# Patient Record
Sex: Female | Born: 1935 | Smoking: Never smoker
Health system: Southern US, Community
[De-identification: ages and names within clinical notes are randomized; demographics above are authoritative.]

## PROBLEM LIST (undated history)

## (undated) DIAGNOSIS — N390 Urinary tract infection, site not specified: Secondary | ICD-10-CM

## (undated) DIAGNOSIS — E079 Disorder of thyroid, unspecified: Secondary | ICD-10-CM

## (undated) DIAGNOSIS — K219 Gastro-esophageal reflux disease without esophagitis: Secondary | ICD-10-CM

## (undated) DIAGNOSIS — I10 Essential (primary) hypertension: Secondary | ICD-10-CM

## (undated) DIAGNOSIS — M810 Age-related osteoporosis without current pathological fracture: Secondary | ICD-10-CM

## (undated) DIAGNOSIS — E785 Hyperlipidemia, unspecified: Secondary | ICD-10-CM

## (undated) DIAGNOSIS — J45909 Unspecified asthma, uncomplicated: Secondary | ICD-10-CM

## (undated) HISTORY — DX: Urinary tract infection, site not specified: N39.0

## (undated) HISTORY — PX: FACIAL COSMETIC SURGERY: SHX629

## (undated) HISTORY — DX: Disorder of thyroid, unspecified: E07.9

## (undated) HISTORY — DX: Age-related osteoporosis without current pathological fracture: M81.0

## (undated) HISTORY — DX: Unspecified asthma, uncomplicated: J45.909

## (undated) HISTORY — DX: Essential (primary) hypertension: I10

## (undated) HISTORY — PX: CATARACT EXTRACTION: SUR2

## (undated) HISTORY — DX: Hyperlipidemia, unspecified: E78.5

## (undated) HISTORY — PX: TRIGGER FINGER RELEASE: SHX641

## (undated) HISTORY — PX: INCONTINENCE SURGERY: SHX676

## (undated) HISTORY — DX: Gastro-esophageal reflux disease without esophagitis: K21.9

## (undated) HISTORY — PX: ABDOMINAL HYSTERECTOMY: SHX81

---

## 2012-04-29 ENCOUNTER — Ambulatory Visit: Payer: Self-pay | Admitting: Physician Assistant

## 2012-05-03 ENCOUNTER — Emergency Department: Payer: Self-pay | Admitting: *Deleted

## 2012-05-03 LAB — URINALYSIS, COMPLETE
Bilirubin,UR: NEGATIVE
Blood: NEGATIVE
Glucose,UR: NEGATIVE mg/dL (ref 0–75)
Leukocyte Esterase: NEGATIVE
Nitrite: NEGATIVE
Ph: 5 (ref 4.5–8.0)
RBC,UR: 1 /HPF (ref 0–5)
Squamous Epithelial: 12
WBC UR: 1 /HPF (ref 0–5)

## 2012-05-03 LAB — CBC
HGB: 13 g/dL (ref 12.0–16.0)
MCH: 32.8 pg (ref 26.0–34.0)
MCHC: 33.3 g/dL (ref 32.0–36.0)
MCV: 99 fL (ref 80–100)
Platelet: 351 10*3/uL (ref 150–440)
RBC: 3.97 10*6/uL (ref 3.80–5.20)
RDW: 12.9 % (ref 11.5–14.5)

## 2012-05-03 LAB — COMPREHENSIVE METABOLIC PANEL
Alkaline Phosphatase: 70 U/L (ref 50–136)
Anion Gap: 7 (ref 7–16)
BUN: 12 mg/dL (ref 7–18)
Bilirubin,Total: 0.3 mg/dL (ref 0.2–1.0)
Calcium, Total: 9.1 mg/dL (ref 8.5–10.1)
Chloride: 101 mmol/L (ref 98–107)
Co2: 28 mmol/L (ref 21–32)
Creatinine: 1.01 mg/dL (ref 0.60–1.30)
EGFR (African American): >60
EGFR (Non-African Amer.): 53 — ABNORMAL LOW
SGOT(AST): 24 U/L (ref 15–37)
SGPT (ALT): 17 U/L (ref 12–78)
Total Protein: 8.1 g/dL (ref 6.4–8.2)

## 2017-10-04 DIAGNOSIS — N390 Urinary tract infection, site not specified: Secondary | ICD-10-CM | POA: Insufficient documentation

## 2017-10-04 DIAGNOSIS — M810 Age-related osteoporosis without current pathological fracture: Secondary | ICD-10-CM | POA: Insufficient documentation

## 2017-10-04 DIAGNOSIS — K219 Gastro-esophageal reflux disease without esophagitis: Secondary | ICD-10-CM | POA: Insufficient documentation

## 2017-10-04 DIAGNOSIS — E7800 Pure hypercholesterolemia, unspecified: Secondary | ICD-10-CM | POA: Insufficient documentation

## 2017-10-04 DIAGNOSIS — I10 Essential (primary) hypertension: Secondary | ICD-10-CM | POA: Insufficient documentation

## 2017-10-04 DIAGNOSIS — J4540 Moderate persistent asthma, uncomplicated: Secondary | ICD-10-CM | POA: Insufficient documentation

## 2017-10-04 DIAGNOSIS — E039 Hypothyroidism, unspecified: Secondary | ICD-10-CM | POA: Insufficient documentation

## 2018-10-10 ENCOUNTER — Ambulatory Visit (INDEPENDENT_AMBULATORY_CARE_PROVIDER_SITE_OTHER): Payer: Medicare Other | Admitting: Urology

## 2018-10-10 ENCOUNTER — Encounter: Payer: Self-pay | Admitting: Urology

## 2018-10-10 VITALS — BP 130/66 | HR 80 | Ht 59.0 in | Wt 131.9 lb

## 2018-10-10 DIAGNOSIS — N390 Urinary tract infection, site not specified: Secondary | ICD-10-CM | POA: Diagnosis not present

## 2018-10-10 LAB — MICROSCOPIC EXAMINATION

## 2018-10-10 LAB — URINALYSIS, COMPLETE
BILIRUBIN UA: NEGATIVE
Glucose, UA: NEGATIVE
Ketones, UA: NEGATIVE
Nitrite, UA: NEGATIVE
PROTEIN UA: NEGATIVE
Specific Gravity, UA: 1.015 (ref 1.005–1.030)
Urobilinogen, Ur: 0.2 mg/dL (ref 0.2–1.0)
pH, UA: 6 (ref 5.0–7.5)

## 2018-10-10 MED ORDER — SULFAMETHOXAZOLE-TRIMETHOPRIM 800-160 MG PO TABS: 1.0000 | ORAL_TABLET | Freq: Every day | ORAL | 6 refills | Status: DC

## 2018-10-10 NOTE — Progress Notes (Signed)
   10/10/2018 3:14 PM   Shelby Gentry April 02, 1931 [id number]  Referring provider: Virgina Jock, [place] [address] [place], [place] [address]  CC: Recurrent UTI  HPI: I saw Shelby Gentry in urology clinic today in consultation for recurrent urinary tract infections.  She is an 85 year old female with a long history of recurrent infections, with 4-5 UTIs per year for at least 5 years.  In care everywhere, she has multiple recent positive urine cultures including 09/11/2018(E. Coli), 08/23/2018(E. Coli), 06/08/2018(Citrobacter), and 05/12/2018 (Citrobacter).  Her history is notable for a reported "bladder sling " 40 years ago.  She denies any significant urinary incontinence.  When she has a UTI her symptoms are primarily dysuria and pelvic pain.  She denies any history of gross hematuria or flank pain.  She was previously evaluated by a urologist in [place] that recommended cranberry prophylaxis.  She has been on this for 3 months without any improvement.  There are no aggravating or alleviating factors.  Severity is moderate to severe.   PMH: Hypothyroidism Hypercholesterolemia Hypertension Recurrent UTI Osteoporosis  Surgical History: Prior bladder sling  Allergies: Allergies not on file  Family History: No family history on file.  Social History:  has no history on file for tobacco, alcohol, and drug.  ROS: Please see flowsheet from today's date for complete review of systems.  Physical Exam:  Constitutional:  Alert and oriented, No acute distress. Cardiovascular: No clubbing, cyanosis, or edema. Respiratory: Normal respiratory effort, no increased work of breathing. GI: Abdomen is soft, nontender, nondistended, no abdominal masses GU: Deferred until time of cystoscopy Lymph: No cervical or inguinal lymphadenopathy. Skin: No rashes, bruises or suspicious lesions. Neurologic: Grossly intact, no focal deficits, moving all 4 extremities. Psychiatric: Normal mood and  affect.  Laboratory Data: Prior urine cultures reviewed, see HPI  Urinalysis today 0-5 WBCs, 0-2 RBCs, few bacteria, nitrite negative  Pertinent Imaging: None to review  Assessment & Plan:   In summary, the patient is an 85 year old female with a history of culture documented recurrent urinary tract infections, distant history of "bladder sling" who presents for further evaluation.  We discussed the evaluation and treatment of patients with recurrent UTIs at length.  We specifically discussed the differences between asymptomatic bacteriuria and true urinary tract infection.  We discussed the AUA definition of recurrent UTI of at least 2 culture proven symptomatic acute cystitis episodes in a 53-month period, or 3 within a 1 year period.  We discussed the importance of culture directed antibiotic treatment, and antibiotic stewardship.  First-line therapy includes nitrofurantoin(5 days), Bactrim(3 days), or fosfomycin(3 g single dose).  Possible etiologies of recurrent infection include periurethral tissue atrophy in postmenopausal woman, constipation, sexual activity, incomplete emptying, anatomic abnormalities, and even genetic predisposition.  Finally, we discussed the role of perineal hygiene, timed voiding, adequate hydration, topical vaginal estrogen, cranberry prophylaxis, and low-dose antibiotic prophylaxis.  -Trial of Bactrim prophylaxis, vaginal estrogen cream, continue cranberry supplements -Follow-up for cystoscopy to rule out erosion of bladder mesh -Consider further imaging if ongoing urinary tract infections  Sondra Come, MD  [facility] [address] [place], [place] [address] [phone]

## 2018-10-10 NOTE — Addendum Note (Signed)
Addended by: Frankey Shown on: 10/10/2018 03:42 PM   Modules accepted: Orders

## 2018-10-10 NOTE — Patient Instructions (Signed)
Urinary Tract Infection, Adult A urinary tract infection (UTI) is an infection of any part of the urinary tract. The urinary tract includes:  The kidneys.  The ureters.  The bladder.  The urethra. These organs make, store, and get rid of pee (urine) in the body. What are the causes? This is caused by germs (bacteria) in your genital area. These germs grow and cause swelling (inflammation) of your urinary tract. What increases the risk? You are more likely to develop this condition if:  You have a small, thin tube (catheter) to drain pee.  You cannot control when you pee or poop (incontinence).  You are female, and: ? You use these methods to prevent pregnancy: ? A medicine that kills sperm (spermicide). ? A device that blocks sperm (diaphragm). ? You have low levels of a female hormone (estrogen). ? You are pregnant.  You have genes that add to your risk.  You are sexually active.  You take antibiotic medicines.  You have trouble peeing because of: ? A prostate that is bigger than normal, if you are female. ? A blockage in the part of your body that drains pee from the bladder (urethra). ? A kidney stone. ? A nerve condition that affects your bladder (neurogenic bladder). ? Not getting enough to drink. ? Not peeing often enough.  You have other conditions, such as: ? Diabetes. ? A weak disease-fighting system (immune system). ? Sickle cell disease. ? Gout. ? Injury of the spine. What are the signs or symptoms? Symptoms of this condition include:  Needing to pee right away (urgently).  Peeing often.  Peeing small amounts often.  Pain or burning when peeing.  Blood in the pee.  Pee that smells bad or not like normal.  Trouble peeing.  Pee that is cloudy.  Fluid coming from the vagina, if you are female.  Pain in the belly or lower back. Other symptoms include:  Throwing up (vomiting).  No urge to eat.  Feeling mixed up (confused).  Being tired  and grouchy (irritable).  A fever.  Watery poop (diarrhea). How is this treated? This condition may be treated with:  Antibiotic medicine.  Other medicines.  Drinking enough water. Follow these instructions at home:  Medicines  Take over-the-counter and prescription medicines only as told by your doctor.  If you were prescribed an antibiotic medicine, take it as told by your doctor. Do not stop taking it even if you start to feel better. General instructions  Make sure you: ? Pee until your bladder is empty. ? Do not hold pee for a long time. ? Empty your bladder after sex. ? Wipe from front to back after pooping if you are a female. Use each tissue one time when you wipe.  Drink enough fluid to keep your pee pale yellow.  Keep all follow-up visits as told by your doctor. This is important. Contact a doctor if:  You do not get better after 1-2 days.  Your symptoms go away and then come back. Get help right away if:  You have very bad back pain.  You have very bad pain in your lower belly.  You have a fever.  You are sick to your stomach (nauseous).  You are throwing up. Summary  A urinary tract infection (UTI) is an infection of any part of the urinary tract.  This condition is caused by germs in your genital area.  There are many risk factors for a UTI. These include having a small, thin   tube to drain pee and not being able to control when you pee or poop.  Treatment includes antibiotic medicines for germs.  Drink enough fluid to keep your pee pale yellow. This information is not intended to replace advice given to you by your health care provider. Make sure you discuss any questions you have with your health care provider. Document Released: 01/12/2008 Document Revised: 02/02/2018 Document Reviewed: 02/02/2018   Cystoscopy  Cystoscopy is a procedure that is used to help diagnose and sometimes treat conditions that affect that lower urinary tract. The  lower urinary tract includes the bladder and the tube that drains urine from the bladder out of the body (urethra). Cystoscopy is performed with a thin, tube-shaped instrument with a light and camera at the end (cystoscope). The cystoscope may be hard (rigid) or flexible, depending on the goal of the procedure.The cystoscope is inserted through the urethra, into the bladder. Cystoscopy may be recommended if you have:  Urinary tractinfections that keep coming back (recurring).  Blood in the urine (hematuria).  Loss of bladder control (urinary incontinence) or an overactive bladder.  Unusual cells found in a urine sample.  A blockage in the urethra.  Painful urination.  An abnormality in the bladder found during an intravenous pyelogram (IVP) or CT scan. Cystoscopy may also be done to remove a sample of tissue to be examined under a microscope (biopsy). Tell a health care provider about:  Any allergies you have.  All medicines you are taking, including vitamins, herbs, eye drops, creams, and over-the-counter medicines.  Any problems you or family members have had with anesthetic medicines.  Any blood disorders you have.  Any surgeries you have had.  Any medical conditions you have.  Whether you are pregnant or may be pregnant. What are the risks? Generally, this is a safe procedure. However, problems may occur, including:  Infection.  Bleeding.  Allergic reactions to medicines.  Damage to other structures or organs. What happens before the procedure?  Ask your health care provider about: ? Changing or stopping your regular medicines. This is especially important if you are taking diabetes medicines or blood thinners. ? Taking medicines such as aspirin and ibuprofen. These medicines can thin your blood. Do not take these medicines before your procedure if your health care provider instructs you not to.  Follow instructions from your health care provider about eating or  drinking restrictions.  You may be given antibiotic medicine to help prevent infection.  You may have an exam or testing, such as X-rays of the bladder, urethra, or kidneys.  You may have urine tests to check for signs of infection.  Plan to have someone take you home after the procedure. What happens during the procedure?  To reduce your risk of infection,your health care team will wash or sanitize their hands.  You will be given one or more of the following: ? A medicine to help you relax (sedative). ? A medicine to numb the area (local anesthetic).  The area around the opening of your urethra will be cleaned.  The cystoscope will be passed through your urethra into your bladder.  Germ-free (sterile)fluid will flow through the cystoscope to fill your bladder. The fluid will stretch your bladder so that your surgeon can clearly examine your bladder walls.  The cystoscope will be removed and your bladder will be emptied. The procedure may vary among health care providers and hospitals. What happens after the procedure?  You may have some soreness or pain in  your abdomen and urethra. Medicines will be available to help you.  You may have some blood in your urine.  Do not drive for 24 hours if you received a sedative. This information is not intended to replace advice given to you by your health care provider. Make sure you discuss any questions you have with your health care provider. Document Released: 07/23/2000 Document Revised: 05/06/2017 Document Reviewed: 06/12/2015 Elsevier Interactive Patient Education  2019 [employer].  Risk analyst Patient Education  [employer].

## 2018-10-24 ENCOUNTER — Encounter: Payer: Self-pay | Admitting: Emergency Medicine

## 2018-10-24 ENCOUNTER — Emergency Department
Admission: EM | Admit: 2018-10-24 | Discharge: 2018-10-24 | Disposition: A | Discharge status: Home / Self Care | Payer: Medicare Other | Attending: Emergency Medicine | Admitting: Emergency Medicine

## 2018-10-24 ENCOUNTER — Emergency Department: Payer: Medicare Other

## 2018-10-24 ENCOUNTER — Other Ambulatory Visit: Payer: Self-pay

## 2018-10-24 ENCOUNTER — Other Ambulatory Visit: Payer: Medicare Other | Admitting: Urology

## 2018-10-24 DIAGNOSIS — E871 Hypo-osmolality and hyponatremia: Secondary | ICD-10-CM | POA: Insufficient documentation

## 2018-10-24 DIAGNOSIS — I10 Essential (primary) hypertension: Secondary | ICD-10-CM | POA: Diagnosis not present

## 2018-10-24 DIAGNOSIS — Z7982 Long term (current) use of aspirin: Secondary | ICD-10-CM | POA: Insufficient documentation

## 2018-10-24 DIAGNOSIS — E860 Dehydration: Secondary | ICD-10-CM | POA: Insufficient documentation

## 2018-10-24 DIAGNOSIS — J45909 Unspecified asthma, uncomplicated: Secondary | ICD-10-CM | POA: Diagnosis not present

## 2018-10-24 DIAGNOSIS — E039 Hypothyroidism, unspecified: Secondary | ICD-10-CM | POA: Diagnosis not present

## 2018-10-24 DIAGNOSIS — R55 Syncope and collapse: Secondary | ICD-10-CM | POA: Diagnosis present

## 2018-10-24 DIAGNOSIS — Z79899 Other long term (current) drug therapy: Secondary | ICD-10-CM | POA: Insufficient documentation

## 2018-10-24 DIAGNOSIS — N390 Urinary tract infection, site not specified: Secondary | ICD-10-CM | POA: Insufficient documentation

## 2018-10-24 LAB — URINALYSIS, COMPLETE (UACMP) WITH MICROSCOPIC
Bilirubin Urine: NEGATIVE
Glucose, UA: NEGATIVE mg/dL
Hgb urine dipstick: NEGATIVE
KETONES UR: NEGATIVE mg/dL
Nitrite: POSITIVE — AB
Protein, ur: NEGATIVE mg/dL
Specific Gravity, Urine: 1.013 (ref 1.005–1.030)
pH: 6 (ref 5.0–8.0)

## 2018-10-24 LAB — BASIC METABOLIC PANEL
Anion gap: 10 (ref 5–15)
BUN: 21 mg/dL (ref 8–23)
CO2: 21 mmol/L — ABNORMAL LOW (ref 22–32)
Calcium: 8.5 mg/dL — ABNORMAL LOW (ref 8.9–10.3)
Chloride: 96 mmol/L — ABNORMAL LOW (ref 98–111)
Creatinine, Ser: 1.19 mg/dL — ABNORMAL HIGH (ref 0.44–1.00)
GFR calc Af Amer: 48 mL/min — ABNORMAL LOW (ref >60)
GFR, EST NON AFRICAN AMERICAN: 42 mL/min — AB (ref >60)
GLUCOSE: 104 mg/dL — AB (ref 70–99)
POTASSIUM: 4.3 mmol/L (ref 3.5–5.1)
Sodium: 127 mmol/L — ABNORMAL LOW (ref 135–145)

## 2018-10-24 LAB — CBC
HCT: 30.7 % — ABNORMAL LOW (ref 36.0–46.0)
Hemoglobin: 10.3 g/dL — ABNORMAL LOW (ref 12.0–15.0)
MCH: 31.4 pg (ref 26.0–34.0)
MCHC: 33.6 g/dL (ref 30.0–36.0)
MCV: 93.6 fL (ref 80.0–100.0)
Platelets: 248 10*3/uL (ref 150–400)
RBC: 3.28 MIL/uL — ABNORMAL LOW (ref 3.87–5.11)
RDW: 12.4 % (ref 11.5–15.5)
WBC: 7 10*3/uL (ref 4.0–10.5)
nRBC: 0 % (ref 0.0–0.2)

## 2018-10-24 LAB — TROPONIN I: Troponin I: <0.03 ng/mL (ref <0.03)

## 2018-10-24 MED ORDER — SODIUM CHLORIDE 0.9 % IV BOLUS
1000.0000 mL | Freq: Once | INTRAVENOUS | Status: AC
  Administered 2018-10-24: 1000 mL via INTRAVENOUS

## 2018-10-24 MED ORDER — SODIUM CHLORIDE 0.9 % IV SOLN
1.0000 g | Freq: Once | INTRAVENOUS | Status: AC
  Administered 2018-10-24: 1 g via INTRAVENOUS
  Filled 2018-10-24: qty 10

## 2018-10-24 MED ORDER — ONDANSETRON 4 MG PO TBDP
4.0000 mg | ORAL_TABLET | Freq: Once | ORAL | Status: AC
  Administered 2018-10-24: 4 mg via ORAL
  Filled 2018-10-24: qty 1

## 2018-10-24 MED ORDER — CEPHALEXIN 500 MG PO CAPS: 500.0000 mg | ORAL_CAPSULE | Freq: Three times a day (TID) | ORAL | 0 refills | Status: AC

## 2018-10-24 NOTE — ED Notes (Signed)
ED Provider at bedside. 

## 2018-10-24 NOTE — Discharge Instructions (Addendum)
Please follow up with your primary care doctor to have your blood rechecked to make sure your sodium is back to a normal level. Please seek medical attention for any high fevers, chest pain, shortness of breath, change in behavior, persistent vomiting, bloody stool or any other new or concerning symptoms.

## 2018-10-24 NOTE — ED Notes (Signed)
Patient c/o nausea, headache, and dizziness beginning today. Patient reports hx of vertigo - reports this feels different. Patient denies hx of migraine.

## 2018-10-24 NOTE — ED Notes (Signed)
Reviewed discharge instructions, follow-up care, and prescriptions with patient. Patient verbalized understanding of all information reviewed. Patient stable, with no distress noted at this time.    

## 2018-10-24 NOTE — ED Triage Notes (Signed)
PT arrives with daughter with symptoms that started yesterday "after 2." Initially pt states she felt dizzy, like her left arm was heavy , and like her jaw "was stiff." Pt states today she became nauseated but denies throwing up. Speech clear, symmetrical smile, a & o x 4, no weakness.

## 2018-10-24 NOTE — ED Notes (Signed)
Patient ambulated per MD request. Patient able to ambulate with a steady gait. MD informed.

## 2018-10-24 NOTE — ED Notes (Signed)
Registration at bedside.

## 2018-10-24 NOTE — ED Provider Notes (Signed)
[facility] Emergency Department Provider Note   ____________________________________________   I have reviewed the triage vital signs and the nursing notes.   HISTORY  Chief Complaint Near Syncope   History limited by: Not Limited   HPI Shelby Gentry is a 85 y.o. female who presents to the emergency department today because of concern for dizziness and failing like she might pass out. The patient states that her symptoms started yesterday. It has been fairly consistent since then. She does state that she has had some associated headache. The patient also had some feelings of extremity heaviness and stiffness. Primarily felt it in her left arm. The patient does state that she has recurrent UTIs. Denies any fevers. Denies any chest pain or palpitations with her symptoms today.    Records reviewed. Per medical record review patient has a history of recurrent UTI  Past Medical History:  Diagnosis Date  . Asthma   . GERD (gastroesophageal reflux disease)   . Hyperlipidemia   . Hypertension   . Osteoporosis   . Thyroid disease   . Urinary tract infection     Patient Active Problem List   Diagnosis Date Noted  . Asthma in adult, moderate persistent, uncomplicated 10/04/2017  . Benign essential hypertension 10/04/2017  . Gastroesophageal reflux disease without esophagitis 10/04/2017  . Hypothyroidism (acquired) 10/04/2017  . Osteoporosis, post-menopausal 10/04/2017  . Pure hypercholesterolemia 10/04/2017  . Recurrent UTI (urinary tract infection) 10/04/2017    Past Surgical History:  Procedure Laterality Date  . ABDOMINAL HYSTERECTOMY    . CATARACT EXTRACTION    . FACIAL COSMETIC SURGERY    . INCONTINENCE SURGERY    . TRIGGER FINGER RELEASE Bilateral     Prior to Admission medications   Medication Sig Start Date End Date Taking? Authorizing Provider  aspirin EC 81 MG tablet Take by mouth.    [provider]  atorvastatin (LIPITOR) 20  MG tablet Take by mouth. 04/24/18   [provider]  azelastine (ASTELIN) 0.1 % nasal spray  09/07/18   [provider]  calcium carbonate (OS-CAL) 1250 (500 Ca) MG chewable tablet Chew by mouth.    [provider]  fluticasone (FLONASE) 50 MCG/ACT nasal spray Place into the nose. 10/04/17   [provider]  fluticasone-salmeterol (ADVAIR HFA) 409-81 MCG/ACT inhaler Inhale into the lungs. 06/16/18   [provider]  Ginkgo Biloba 30 MG CAPS Take by mouth.    [provider]  levothyroxine (SYNTHROID, LEVOTHROID) 50 MCG tablet Take by mouth. 08/23/18   [provider]  losartan (COZAAR) 25 MG tablet Take by mouth. 08/30/18   [provider]  Multiple Vitamins-Minerals (PRESERVISION AREDS 2 PO) Take by mouth.    [provider]  Omega-3 Fatty Acids (FISH OIL BURP-LESS) 1200 MG CAPS Take by mouth.    [provider]  omeprazole (PRILOSEC) 40 MG capsule Take by mouth. 10/04/17   [provider]  sulfamethoxazole-trimethoprim (BACTRIM DS,SEPTRA DS) 800-160 MG tablet Take 1 tablet by mouth daily. 10/10/18   Sondra Come, MD  traMADol Janean Sark) 50 MG tablet  06/21/18   [provider]    Allergies Patient has no known allergies.  No family history on file.  Social History Social History   Tobacco Use  . Smoking status: Never Smoker  . Smokeless tobacco: Never Used  Substance Use Topics  . Alcohol use: Not Currently  . Drug use: Never    Review of Systems Constitutional: No fever/chills Eyes: No visual changes.  ENT: No sore throat. Cardiovascular: Denies chest pain. Respiratory: Denies shortness of breath. Gastrointestinal: No abdominal pain. Positive for nausea.  Genitourinary: Negative for dysuria. Musculoskeletal: Positive for left arm heaviness.  Skin: Negative for rash. Neurological: Positive for headache, positive for  dizziness. ____________________________________________   PHYSICAL EXAM:  VITAL SIGNS: ED Triage Vitals [10/24/18 1842]  Enc Vitals Group     BP (!) 159/63     Pulse Rate 73     Resp 18     Temp 98.2 F (36.8 C)     Temp src      SpO2 98 %     Weight 131 lb 14.4 oz (59.8 kg)     Height  (1.499 m)     Head Circumference      Peak Flow      Pain Score 0   Constitutional: Alert and oriented.  Eyes: Conjunctivae are normal.  ENT      Head: Normocephalic and atraumatic.      Nose: No congestion/rhinnorhea.      Mouth/Throat: Mucous membranes are moist.      Neck: No stridor. Hematological/Lymphatic/Immunilogical: No cervical lymphadenopathy. Cardiovascular: Normal rate, regular rhythm.  No murmurs, rubs, or gallops. Respiratory: Normal respiratory effort without tachypnea nor retractions. Breath sounds are clear and equal bilaterally. No wheezes/rales/rhonchi. Gastrointestinal: Soft and non tender. No rebound. No guarding.  Genitourinary: Deferred Musculoskeletal: Normal range of motion in all extremities. No lower extremity edema. Neurologic:  Normal speech and language. No gross focal neurologic deficits are appreciated.  Skin:  Skin is warm, dry and intact. No rash noted. Psychiatric: Mood and affect are normal. Speech and behavior are normal. Patient exhibits appropriate insight and judgment.  ____________________________________________    LABS (pertinent positives/negatives)  Trop <0.03 CBC wbc 7.0, hgb 10.3, plt 248 BMP na 127, k 4.3, glu 104, cr 1.19  ____________________________________________   EKG  I, Phineas Semen, attending physician, personally viewed and interpreted this EKG  EKG Time: 1848 Rate: 69 Rhythm: normal sinus rhythm Axis: left axis deviation Intervals: qtc 437 QRS: narrow ST changes: no st elevation Impression: abnormal ekg   ____________________________________________    RADIOLOGY  CT Head No intracranial  hemorrhage or evidence of large stroke  ____________________________________________   PROCEDURES  Procedures  ____________________________________________   INITIAL IMPRESSION / ASSESSMENT AND PLAN / ED COURSE  Pertinent labs & imaging results that were available during my care of the patient were reviewed by me and considered in my medical decision making (see chart for details).   Patient presented to the emergency department today because of complaints of some headache, nausea and dizziness.  Differential would be broad including infection, electrolyte abnormality, anemia, intracranial process amongst other etiologies.  CT head without any acute findings.  Blood work was notable for a slight hyponatremia.  Per chart review she did have a recent sodium level of 131.  Initial urine was checked and was consistent with a urinary tract infection.  At this point given normal vital signs and lack of leukocytosis did not think that she has sepsis.  She does have recurrent urinary tract infections.  Did discuss all of these findings with the patient.  Patient felt comfortable with plan of IV fluids and IV antibiotics here.  Will plan on discharging with further antibiotics.  Did discuss return precautions.  ____________________________________________   FINAL CLINICAL IMPRESSION(S) / ED DIAGNOSES  Final diagnoses:  Near syncope  Lower urinary tract infectious disease  Dehydration  Hyponatremia     Note:  This dictation was prepared with Dragon dictation. Any transcriptional errors that result from this process are unintentional     Phineas Semen, MD 10/24/18 2258

## 2018-10-24 NOTE — ED Triage Notes (Signed)
Patient says yesterday after 2pm started having headache --generalized-- and dizziness and nausea.  Also says tongue feels stiff when she chews.  She has no drift, equal grips, no facial droop.  She is alert and oriented and was brought here from [facility].

## 2018-10-27 LAB — URINE CULTURE: Culture: >=100000 — AB

## 2019-05-18 ENCOUNTER — Other Ambulatory Visit: Payer: Self-pay

## 2019-05-18 NOTE — Telephone Encounter (Signed)
Pharmacy requested a refill on prophylaxis antibiotic, ok to refill?

## 2019-05-19 MED ORDER — SULFAMETHOXAZOLE-TRIMETHOPRIM 800-160 MG PO TABS: 1.0000 | ORAL_TABLET | Freq: Every day | ORAL | 6 refills | Status: DC

## 2019-05-19 NOTE — Telephone Encounter (Signed)
OK to refill, but she never showed up  for cysto, please schedule her for a clinic visit in the next 3 months to re-discuss cysto and check on her rUTIs, thanks  Nickolas Madrid, MD 05/19/2019

## 2019-05-21 ENCOUNTER — Other Ambulatory Visit: Payer: Self-pay | Admitting: *Deleted

## 2019-05-21 ENCOUNTER — Telehealth: Payer: Self-pay | Admitting: Urology

## 2019-05-21 MED ORDER — SULFAMETHOXAZOLE-TRIMETHOPRIM 800-160 MG PO TABS: 1.0000 | ORAL_TABLET | Freq: Every day | ORAL | 6 refills | Status: DC

## 2019-05-21 MED ORDER — SULFAMETHOXAZOLE-TRIMETHOPRIM 800-160 MG PO TABS: 1.0000 | ORAL_TABLET | Freq: Every day | ORAL | 6 refills | Status: AC

## 2019-05-21 NOTE — Telephone Encounter (Signed)
RX for Bactrim was sent to the incorrect [facility].  It should be sent to [facility] on [address]  [name] is completely out of meds.

## 2019-05-21 NOTE — Telephone Encounter (Signed)
Left patient a message to call and schedule apt

## 2019-05-21 NOTE — Telephone Encounter (Signed)
OK to refill, but she never showed up  for cysto, please schedule her for a clinic visit in the next 3 months to re-discuss cysto and check on her rUTIs, thanks  Nickolas Madrid, MD  Left pt mess to call and schedule

## 2019-05-24 NOTE — Telephone Encounter (Signed)
Spoke with patient she has been scheduled for follow up on 10-19

## 2019-05-28 ENCOUNTER — Encounter: Payer: Self-pay | Admitting: Urology

## 2019-05-28 ENCOUNTER — Other Ambulatory Visit: Payer: Self-pay

## 2019-05-28 ENCOUNTER — Ambulatory Visit (INDEPENDENT_AMBULATORY_CARE_PROVIDER_SITE_OTHER): Payer: Medicare Other | Admitting: Urology

## 2019-05-28 VITALS — BP 133/67 | HR 93 | Ht 59.0 in | Wt 129.0 lb

## 2019-05-28 DIAGNOSIS — N390 Urinary tract infection, site not specified: Secondary | ICD-10-CM

## 2019-05-28 DIAGNOSIS — Z01812 Encounter for preprocedural laboratory examination: Secondary | ICD-10-CM

## 2019-05-28 NOTE — Progress Notes (Signed)
Cystoscopy Procedure Note:  Indication: rUTIs, history of mesh sling  After informed consent and discussion of the procedure and its risks, Shelby Gentry was positioned and prepped in the standard fashion. Cystoscopy was performed with a flexible cystoscope. The urethra, bladder neck and entire bladder was visualized in a standard fashion.The ureteral orifices were visualized in their normal location and orientation. No abnormalities on retroflexion. Mucosa normal throughout.  Findings: Normal cystoscopy  Assessment and Plan: Continue ppx Bactrim for rUTIs RTC yearly   Nickolas Madrid, MD 05/28/2019

## 2019-05-29 LAB — MICROSCOPIC EXAMINATION
Bacteria, UA: NONE SEEN
RBC, Urine: NONE SEEN /hpf (ref 0–2)

## 2019-05-29 LAB — URINALYSIS, COMPLETE
Bilirubin, UA: NEGATIVE
Glucose, UA: NEGATIVE
Ketones, UA: NEGATIVE
Leukocytes,UA: NEGATIVE
Nitrite, UA: NEGATIVE
Protein,UA: NEGATIVE
RBC, UA: NEGATIVE
Specific Gravity, UA: 1.02 (ref 1.005–1.030)
Urobilinogen, Ur: 0.2 mg/dL (ref 0.2–1.0)
pH, UA: 5.5 (ref 5.0–7.5)

## 2020-04-26 IMAGING — CT CT HEAD WITHOUT CONTRAST
3 series · 15 of 45 positions shown, 18 images · non-contrast
Comparison: None.

CLINICAL DATA: 87-year-old female with headache starting yesterday.
Dizziness and nausea. Initial encounter.

EXAM:
CT HEAD WITHOUT CONTRAST
TECHNIQUE: Contiguous axial images were obtained from the base of the skull
through the vertex without intravenous contrast.

[Series 2: head wo · axial · 0.41mm/px · z∈[+390,+505]mm · 9 of 28 slices shown, 12 images]
[im 3/28  brain]
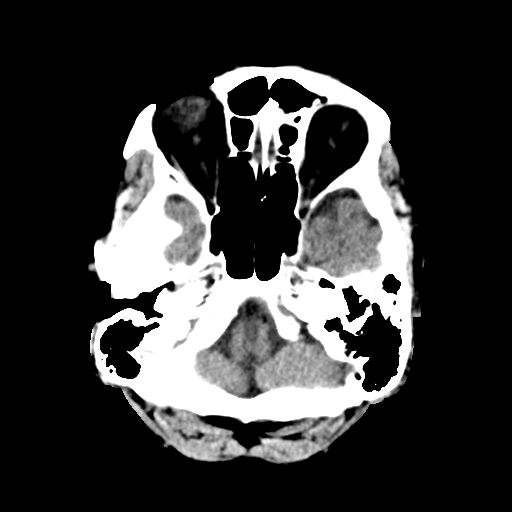
[im 3/28  bone]
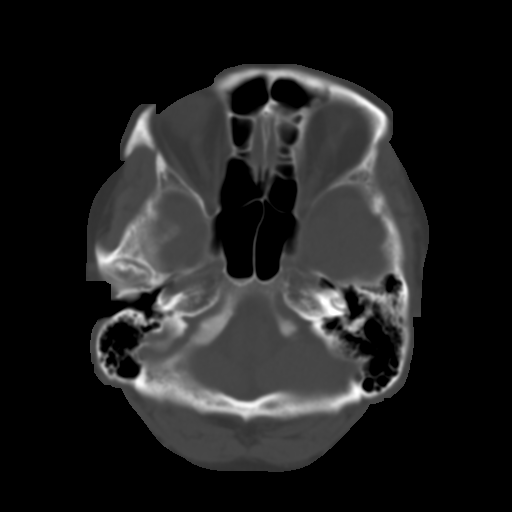
[im 6/28  brain]
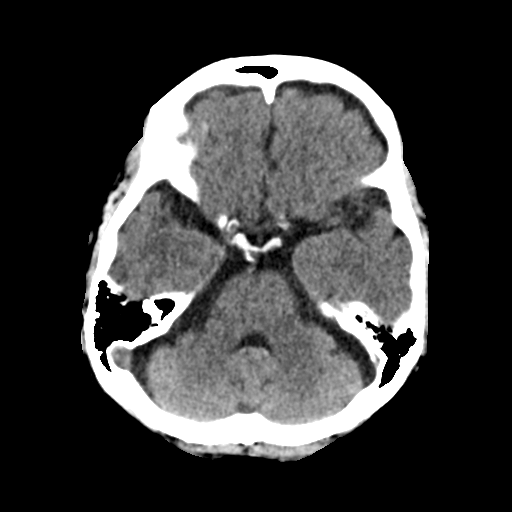
[im 9/28  brain]
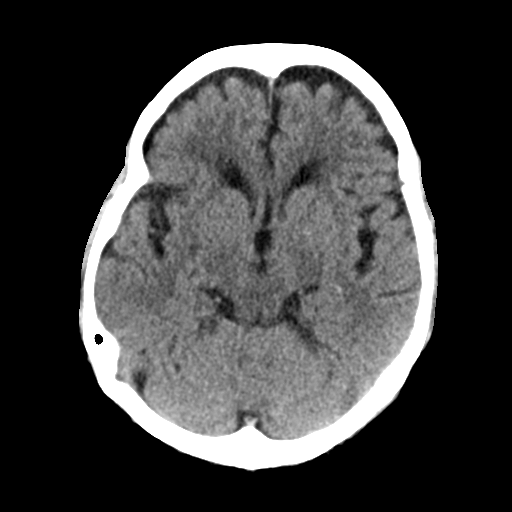
[im 12/28  brain]
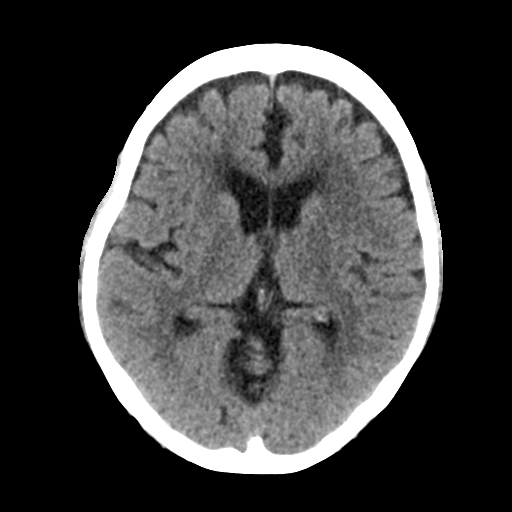
[im 15/28  brain]
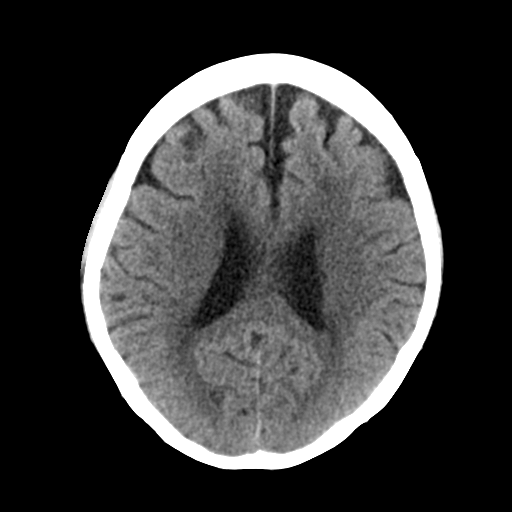
[im 15/28  bone]
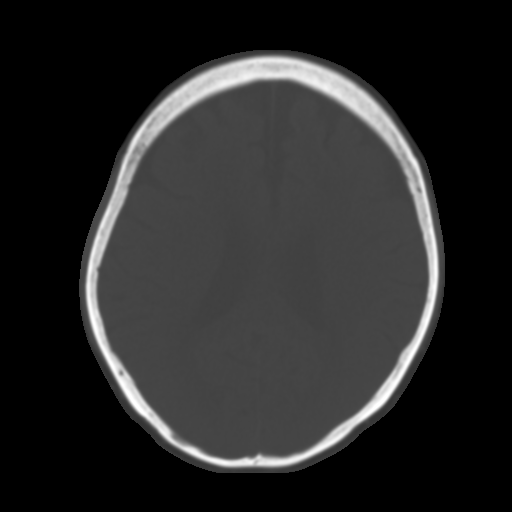
[im 17/28  brain]
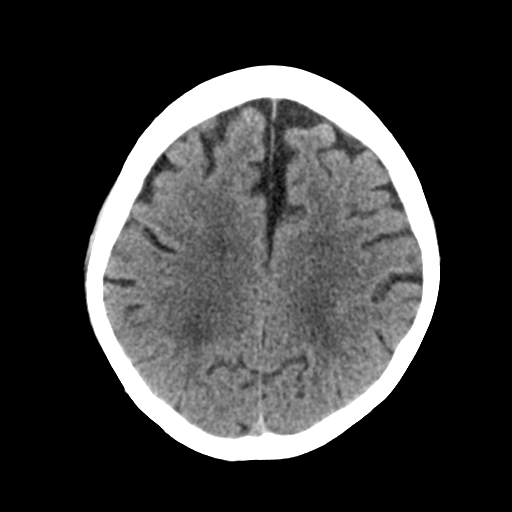
[im 20/28  brain]
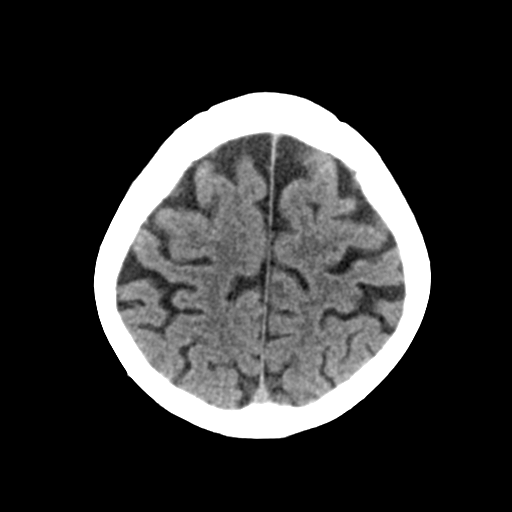
[im 23/28  brain]
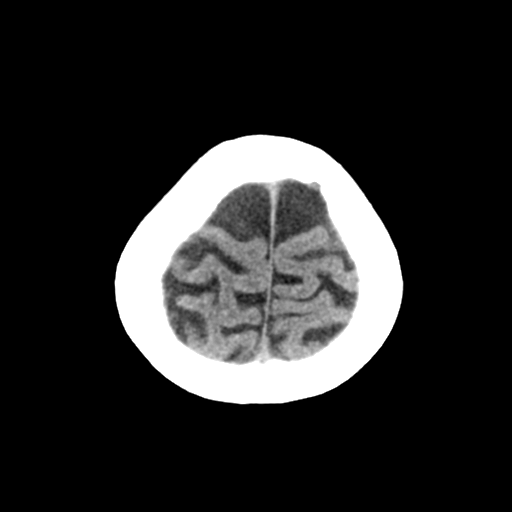
[im 26/28  brain]
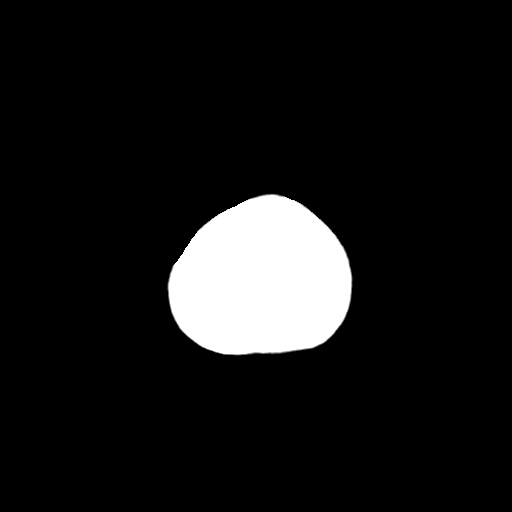
[im 26/28  bone]
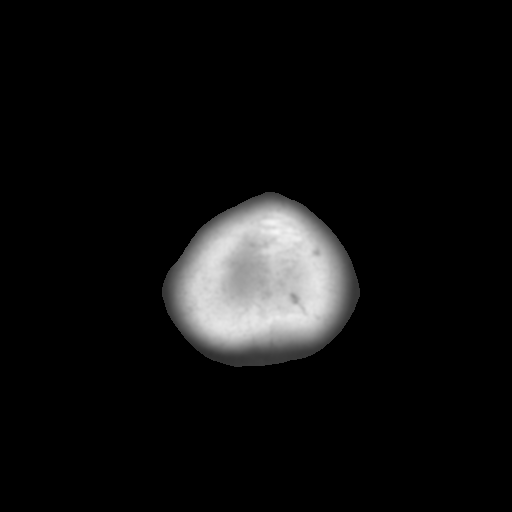

[Series 4: coronal soft tissue · coronal · 0.27mm/px · 3 of 60 slices shown]
[im 20/60  brain]
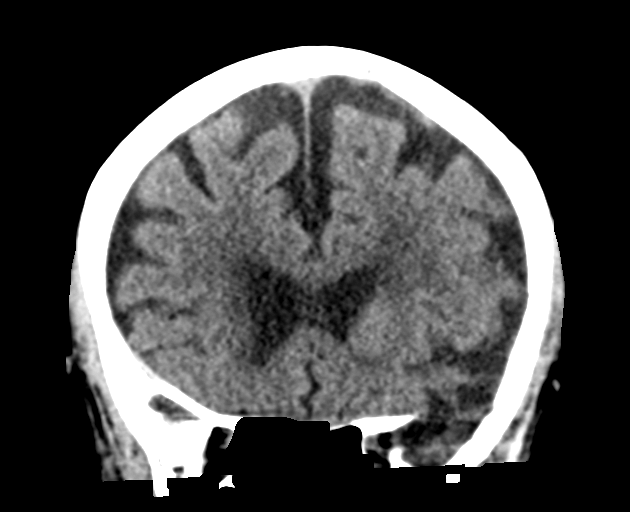
[im 27/60  brain]
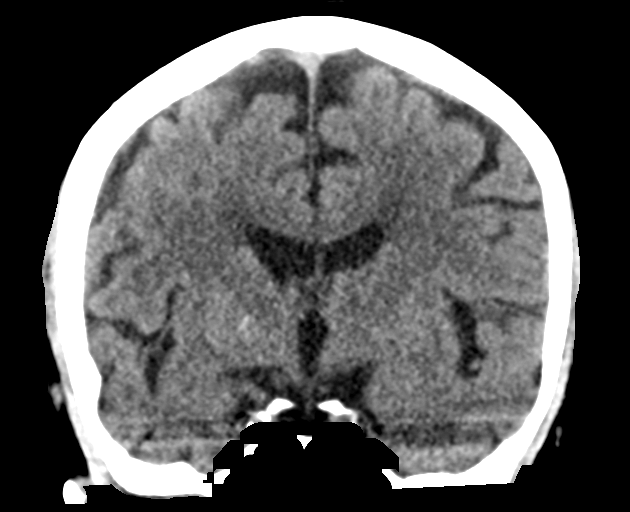
[im 33/60  brain]
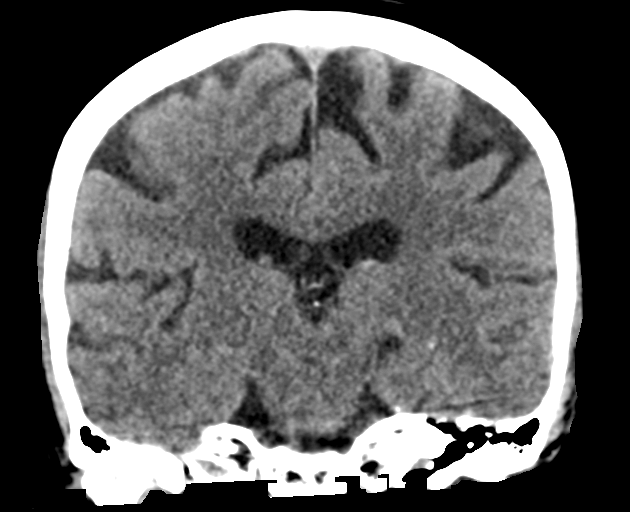

[Series 5: sagittal soft tissue · sagittal · 0.29mm/px · 3 of 51 slices shown]
[im 17/51  brain]
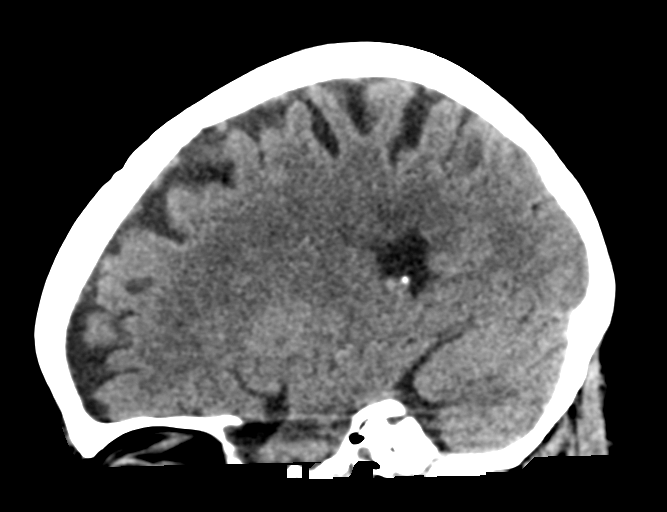
[im 26/51  brain]
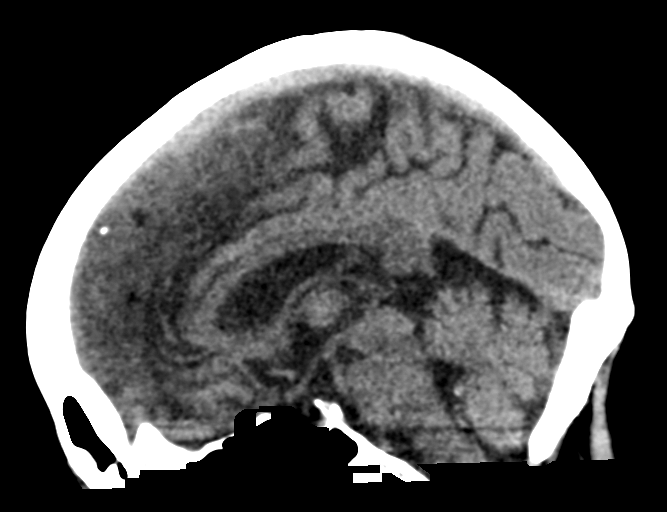
[im 34/51  brain]
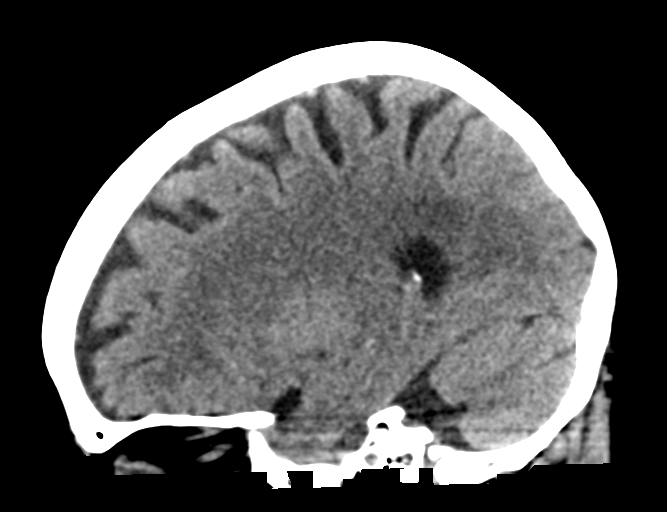

[15 of 45 positions shown; findings below may reference images not displayed]

FINDINGS: Brain: No intracranial hemorrhage or CT evidence of large acute
infarct. Chronic microvascular changes. Global atrophy. No
intracranial mass lesion noted on this unenhanced exam.

Vascular: Vascular calcifications.  No acute hyperdense vessel.

Skull: No acute abnormality.

Sinuses/Orbits: No acute orbital abnormality. Minimal mucosal
thickening right sphenoid sinus. Minimal polypoid opacification left
frontal sinus. Remainder of paranasal sinuses clear. Mastoid air
cells which are visualized and middle ear cavities are clear.

Other: Staple posterosuperior to the left ear of indeterminate
significance/etiology. No surrounding soft tissue swelling no
underlying fracture.
IMPRESSION: 1. No intracranial hemorrhage or CT evidence of large acute infarct.
2. Chronic microvascular changes.
3. Global atrophy.
4. Staple posterosuperior to the left ear of indeterminate
significance/etiology.
5. Minimal mucosal thickening right sphenoid sinus air cell.
